# Patient Record
Sex: Male | Born: 1995 | Race: White | Hispanic: No | Marital: Single | Smoking: Never smoker
Health system: Southern US, Community
[De-identification: ages and names within clinical notes are randomized; demographics above are authoritative.]

---
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
## 2017-05-20 ENCOUNTER — Emergency Department (HOSPITAL_COMMUNITY)
Admission: EM | Admit: 2017-05-20 | Discharge: 2017-05-21 | Disposition: A | Discharge status: Home / Self Care | Payer: BLUE CROSS/BLUE SHIELD | Attending: Emergency Medicine | Admitting: Emergency Medicine

## 2017-05-20 ENCOUNTER — Encounter (HOSPITAL_COMMUNITY): Payer: Self-pay | Admitting: *Deleted

## 2017-05-20 DIAGNOSIS — R55 Syncope and collapse: Secondary | ICD-10-CM | POA: Diagnosis not present

## 2017-05-20 DIAGNOSIS — S80862A Insect bite (nonvenomous), left lower leg, initial encounter: Secondary | ICD-10-CM | POA: Diagnosis present

## 2017-05-20 DIAGNOSIS — R6889 Other general symptoms and signs: Secondary | ICD-10-CM | POA: Diagnosis not present

## 2017-05-20 DIAGNOSIS — Y998 Other external cause status: Secondary | ICD-10-CM | POA: Insufficient documentation

## 2017-05-20 DIAGNOSIS — Y9389 Activity, other specified: Secondary | ICD-10-CM | POA: Diagnosis not present

## 2017-05-20 DIAGNOSIS — W57XXXA Bitten or stung by nonvenomous insect and other nonvenomous arthropods, initial encounter: Secondary | ICD-10-CM | POA: Insufficient documentation

## 2017-05-20 DIAGNOSIS — Y929 Unspecified place or not applicable: Secondary | ICD-10-CM | POA: Diagnosis not present

## 2017-05-20 LAB — URINALYSIS, ROUTINE W REFLEX MICROSCOPIC
Bacteria, UA: NONE SEEN
Bilirubin Urine: NEGATIVE
GLUCOSE, UA: NEGATIVE mg/dL
KETONES UR: NEGATIVE mg/dL
LEUKOCYTES UA: NEGATIVE
Nitrite: NEGATIVE
Protein, ur: NEGATIVE mg/dL
RBC / HPF: NONE SEEN RBC/hpf (ref 0–5)
SPECIFIC GRAVITY, URINE: 1.003 — AB (ref 1.005–1.030)
Squamous Epithelial / HPF: NONE SEEN
pH: 6 (ref 5.0–8.0)

## 2017-05-20 NOTE — ED Triage Notes (Signed)
Pt c/o insect bite to left lower leg, nausea and chills that started today,

## 2017-05-20 NOTE — ED Notes (Signed)
Hobson, PA at bedside.  

## 2017-05-21 LAB — BASIC METABOLIC PANEL
Anion gap: 13 (ref 5–15)
BUN: 12 mg/dL (ref 6–20)
CO2: 24 mmol/L (ref 22–32)
Calcium: 9.2 mg/dL (ref 8.9–10.3)
Chloride: 104 mmol/L (ref 101–111)
Creatinine, Ser: 1.08 mg/dL (ref 0.61–1.24)
GFR calc Af Amer: >60 mL/min (ref >60)
GFR calc non Af Amer: >60 mL/min (ref >60)
Glucose, Bld: 92 mg/dL (ref 65–99)
Potassium: 3.4 mmol/L — ABNORMAL LOW (ref 3.5–5.1)
SODIUM: 141 mmol/L (ref 135–145)

## 2017-05-21 LAB — CBC WITH DIFFERENTIAL/PLATELET
Basophils Absolute: 0 10*3/uL (ref 0.0–0.1)
Basophils Relative: 1 %
Eosinophils Absolute: 0.1 10*3/uL (ref 0.0–0.7)
Eosinophils Relative: 1 %
HEMATOCRIT: 43.2 % (ref 39.0–52.0)
HEMOGLOBIN: 15.2 g/dL (ref 13.0–17.0)
LYMPHS ABS: 1.7 10*3/uL (ref 0.7–4.0)
LYMPHS PCT: 29 %
MCH: 31.2 pg (ref 26.0–34.0)
MCHC: 35.2 g/dL (ref 30.0–36.0)
MCV: 88.7 fL (ref 78.0–100.0)
Monocytes Absolute: 0.6 10*3/uL (ref 0.1–1.0)
Monocytes Relative: 11 %
Neutro Abs: 3.4 10*3/uL (ref 1.7–7.7)
Neutrophils Relative %: 58 %
Platelets: 175 10*3/uL (ref 150–400)
RBC: 4.87 MIL/uL (ref 4.22–5.81)
RDW: 12.9 % (ref 11.5–15.5)
WBC: 5.9 10*3/uL (ref 4.0–10.5)

## 2017-05-21 NOTE — ED Provider Notes (Signed)
[facility] Provider Note   CSN: [id number] Arrival date & time: 05/20/17  2230     History   Chief Complaint Chief Complaint  Patient presents with  . Insect Bite    HPI Jeffrey Chavez is a 21 y.o. male.  Patient is a 21 year old male who presents to the emergency department with a complaint of insect bite to the left lower leg, nausea, and near syncopal episode.  The patient states that early this morning he noted a spot on his leg with some increased redness around it. He drew a circle around it and went on to work and to his regular activities. This afternoon he noted that his eyes began to feel heavy and he had some numbness in his upper extremity on. He thinks that for about 5 seconds steady everything seemed to have gone black for 5 second period time. He said that he recovered and had no residual problem. Later during the day he was at home he had 3 beers. Around 10 PM he ate. After that he was up walking about he been given a doxycycline tablets to take from his mother because of the insect bite on his leg. Approximately 20-30 minutes after receiving the doxycycline he began feeling sick. He had dry heaves, and he he once again felt as though he may pass out. All through the day he has not measured any temperature elevations. He is not removed any ticks that he is aware of. He does not recall having any food that he thought may have been ill appearing spoiled. He's not taking any medications. He's not taking any over-the-counter herbs or vitamins. He's not been out of the country recently. He is not sure if he has been around anyone his been ill or not.      History reviewed. No pertinent past medical history.  There are no active problems to display for this patient.   History reviewed. No pertinent surgical history.     Home Medications    Prior to Admission medications   Not on File    Family History History reviewed. No pertinent family  history.  Social History Social History  Substance Use Topics  . Smoking status: Never Smoker  . Smokeless tobacco: Current User  . Alcohol use Yes     Comment: 3 beers today,      Allergies   Patient has no known allergies.   Review of Systems Review of Systems  Constitutional: Positive for chills.  Gastrointestinal: Positive for nausea.  Musculoskeletal: Positive for myalgias.  Skin:       Insect bite  Neurological: Positive for light-headedness.     Physical Exam Updated Vital Signs BP 129/76   Pulse 83   Temp 98.2 F (36.8 C) (Oral)   Resp 20   Ht 5\' 10"  (1.778 m)   Wt 76.2 kg (168 lb)   SpO2 100%   BMI 24.11 kg/m   Physical Exam  Constitutional: Vital signs are normal. He appears well-developed and well-nourished. He is active.  HENT:  Head: Normocephalic and atraumatic.  Right Ear: Tympanic membrane, external ear and ear canal normal.  Left Ear: Tympanic membrane, external ear and ear canal normal.  Nose: Nose normal.  Mouth/Throat: Uvula is midline, oropharynx is clear and moist and mucous membranes are normal.  Eyes: Conjunctivae, EOM and lids are normal. Pupils are equal, round, and reactive to light.  Neck: Trachea normal, normal range of motion and phonation normal. Neck supple. Carotid bruit is not  present.  Cardiovascular: Normal rate, regular rhythm and normal pulses.   Abdominal: Soft. Normal appearance and bowel sounds are normal.  Musculoskeletal:  There is what appears to be an insect bite to the left lower leg. There is some increased redness around the bite area. Just below the redness is what appears to be an abrasion. There no red streaks appreciated. There is full range of motion of upper and lower extremities.  Lymphadenopathy:       Head (right side): No submental, no preauricular and no posterior auricular adenopathy present.       Head (left side): No submental, no preauricular and no posterior auricular adenopathy present.    He has  no cervical adenopathy.  Neurological: He is alert. He has normal strength. No cranial nerve deficit or sensory deficit. GCS eye subscore is 4. GCS verbal subscore is 5. GCS motor subscore is 6.  Gait is intact. There no gross neurologic deficits appreciated. No motor or sensory deficits appreciated at all.  Skin: Skin is warm and dry.  Psychiatric: His speech is normal.     ED Treatments / Results  Labs (all labs ordered are listed, but only abnormal results are displayed) Labs Reviewed  BASIC METABOLIC PANEL - Abnormal; Notable for the following:       Result Value   Potassium 3.4 (*)    All other components within normal limits  URINALYSIS, ROUTINE W REFLEX MICROSCOPIC - Abnormal; Notable for the following:    Color, Urine STRAW (*)    Specific Gravity, Urine 1.003 (*)    Hgb urine dipstick SMALL (*)    All other components within normal limits  CBC WITH DIFFERENTIAL/PLATELET    EKG  EKG Interpretation  Date/Time:  Wednesday May 20 2017 23:39:31 EDT Ventricular Rate:  79 PR Interval:    QRS Duration: 99 QT Interval:  366 QTC Calculation: 420 R Axis:   56 Text Interpretation:  Sinus rhythm Borderline short PR interval Otherwise within normal limits No old tracing to compare Confirmed by Devoria Albe ([id number]) on 05/20/2017 11:54:35 PM       Radiology No results found.  Procedures Procedures (including critical care time)  Medications Ordered in ED Medications - No data to display   Initial Impression / Assessment and Plan / ED Course  I have reviewed the triage vital signs and the nursing notes.  Pertinent labs & imaging results that were available during my care of the patient were reviewed by me and considered in my medical decision making (see chart for details).       Final Clinical Impressions(s) / ED Diagnoses MDM Vital signs reviewed. Orthostatic blood pressure check has been ordered.  The patient is awake and alert conversing with his family in no  distress whatsoever. He is also on the phone without any difficulty whatsoever. There's been no further feeling of passing out while being in the emergency department. And the patient has not been having nausea or vomiting. The basic metabolic panel shows the potassium be slightly low at 3.4  Patient is drinking liquids in the emergency department without problem.  Patient will return to the emergency department or follow-up with a primary physician if any changes or problems. Cautioned patient to use extra caution while in the excess heat. He knowledge is understanding of the discharge structures.    Final diagnoses:  Near syncope  Heat intolerance    New Prescriptions New Prescriptions   No medications on file     Ivery Quale,  PA-C 05/21/17 0104    Bethann BerkshireZammit, Joseph, MD 05/22/17 2318

## 2017-05-21 NOTE — Discharge Instructions (Signed)
Please increase water, Gatorade, liquids etc. Please use caution in the heat. See your primary physician or return to the emergency department if any recurrence of your symptoms or changes in your general health.

## 2020-02-07 ENCOUNTER — Other Ambulatory Visit: Payer: Self-pay

## 2020-02-07 ENCOUNTER — Ambulatory Visit: Payer: 59 | Attending: Internal Medicine

## 2020-02-07 DIAGNOSIS — Z20822 Contact with and (suspected) exposure to covid-19: Secondary | ICD-10-CM

## 2020-02-08 ENCOUNTER — Telehealth: Payer: Self-pay | Admitting: *Deleted

## 2020-02-08 LAB — NOVEL CORONAVIRUS, NAA: SARS-CoV-2, NAA: NOT DETECTED

## 2020-02-08 NOTE — Telephone Encounter (Signed)
Reviewed negative Covid19 results with the patient. No further questions. 

## 2023-01-17 ENCOUNTER — Ambulatory Visit: Payer: Self-pay

## 2023-01-17 ENCOUNTER — Ambulatory Visit
Admission: EM | Admit: 2023-01-17 | Discharge: 2023-01-17 | Disposition: A | Discharge status: Home / Self Care | Payer: 59 | Attending: Family Medicine | Admitting: Family Medicine

## 2023-01-17 DIAGNOSIS — K047 Periapical abscess without sinus: Secondary | ICD-10-CM

## 2023-01-17 MED ORDER — AMOXICILLIN-POT CLAVULANATE 875-125 MG PO TABS: 1.0000 | ORAL_TABLET | Freq: Two times a day (BID) | ORAL | 0 refills | Status: AC

## 2023-01-17 MED ORDER — CHLORHEXIDINE GLUCONATE 0.12 % MT SOLN: 15.0000 mL | Freq: Two times a day (BID) | OROMUCOSAL | 0 refills | Status: AC

## 2023-01-17 NOTE — ED Triage Notes (Signed)
Pt reports bottom left tooth abscess x 1 day  Has been given penicillin for it before. has upcoming dental work at the end of February. Pain is becoming intolerable. Keeps taking advil

## 2023-01-17 NOTE — ED Provider Notes (Signed)
[facility]    CSN: [id number] Arrival date & time: 01/17/23  [id number]      History   Chief Complaint Chief Complaint  Patient presents with   Dental Pain    HPI Jeffrey Chavez is a 27 y.o. male.   Patient presenting today with left posterior molar pain, swelling of the gums that has become significantly worse over the past day or so.  States he recently had a root canal a tooth but is waiting for more dental work next month.  Denies fever, chills, drainage of the bowel, difficulty breathing or swallowing.  Trying over-the-counter pain relievers and soda gargles with no relief.    History reviewed. No pertinent past medical history.  There are no problems to display for this patient.   History reviewed. No pertinent surgical history.     Home Medications    Prior to Admission medications   Medication Sig Start Date End Date Taking? Authorizing Provider  amoxicillin-clavulanate (AUGMENTIN) 875-125 MG tablet Take 1 tablet by mouth every 12 (twelve) hours. 01/17/23  Yes Volney American, PA-C  chlorhexidine (PERIDEX) 0.12 % solution Use as directed 15 mLs in the mouth or throat 2 (two) times daily. 01/17/23  Yes Volney American, PA-C    Family History History reviewed. No pertinent family history.  Social History Social History   Tobacco Use   Smoking status: Never   Smokeless tobacco: Current  Substance Use Topics   Alcohol use: Yes    Comment: 3 beers today,      Allergies   Patient has no known allergies.   Review of Systems Review of Systems Per HPI  Physical Exam Triage Vital Signs ED Triage Vitals  Enc Vitals Group     BP 01/17/23 0846 130/85     Pulse Rate 01/17/23 0846 76     Resp 01/17/23 0846 20     Temp 01/17/23 0846 98.5 F (36.9 C)     Temp Source 01/17/23 0846 Oral     SpO2 01/17/23 0846 98 %     Weight --      Height --      Head Circumference --      Peak Flow --      Pain Score 01/17/23 0848 8     Pain  Loc --      Pain Edu? --      Excl. in [place]? --    No data found.  Updated Vital Signs BP 130/85 (BP Location: Right Arm)   Pulse 76   Temp 98.5 F (36.9 C) (Oral)   Resp 20   SpO2 98%   Visual Acuity Right Eye Distance:   Left Eye Distance:   Bilateral Distance:    Right Eye Near:   Left Eye Near:    Bilateral Near:     Physical Exam Vitals and nursing note reviewed.  Constitutional:      Appearance: Normal appearance.  HENT:     Head: Atraumatic.     Mouth/Throat:     Mouth: Mucous membranes are moist.     Pharynx: Oropharynx is clear.     Comments: Gingival erythema, edema surrounding left lower posterior molar in the area of his pain Eyes:     Extraocular Movements: Extraocular movements intact.     Conjunctiva/sclera: Conjunctivae normal.  Cardiovascular:     Rate and Rhythm: Normal rate.  Pulmonary:     Effort: Pulmonary effort is normal.  Musculoskeletal:  General: Normal range of motion.     Cervical back: Normal range of motion and neck supple.  Lymphadenopathy:     Cervical: No cervical adenopathy.  Skin:    General: Skin is warm and dry.  Neurological:     General: No focal deficit present.     Mental Status: He is oriented to person, place, and time.  Psychiatric:        Mood and Affect: Mood normal.        Thought Content: Thought content normal.        Judgment: Judgment normal.    UC Treatments / Results  Labs (all labs ordered are listed, but only abnormal results are displayed) Labs Reviewed - No data to display  EKG   Radiology No results found.  Procedures Procedures (including critical care time)  Medications Ordered in UC Medications - No data to display  Initial Impression / Assessment and Plan / UC Course  I have reviewed the triage vital signs and the nursing notes.  Pertinent labs & imaging results that were available during my care of the patient were reviewed by me and considered in my medical decision making  (see chart for details).     Treat with Augmentin, Peridex, salt water gargles, over-the-counter pain relievers.  Already scheduled for dental work next month.  Return for worsening symptoms.  Final Clinical Impressions(s) / UC Diagnoses   Final diagnoses:  Dental infection   Discharge Instructions   None    ED Prescriptions     Medication Sig Dispense Auth. Provider   amoxicillin-clavulanate (AUGMENTIN) 875-125 MG tablet Take 1 tablet by mouth every 12 (twelve) hours. 14 tablet Volney American, [place]   chlorhexidine (PERIDEX) 0.12 % solution Use as directed 15 mLs in the mouth or throat 2 (two) times daily. 120 mL Volney American, [place]      PDMP not reviewed this encounter.   Merrie Roof [place], [place] 01/17/23 [phone]
